# Patient Record
Sex: Female | Born: 1940 | Race: White | Hispanic: No | Marital: Married | Smoking: Never smoker
Health system: Southern US, Community
[De-identification: ages and names within clinical notes are randomized; demographics above are authoritative.]

## PROBLEM LIST (undated history)

## (undated) DIAGNOSIS — I10 Essential (primary) hypertension: Secondary | ICD-10-CM

## (undated) DIAGNOSIS — E119 Type 2 diabetes mellitus without complications: Secondary | ICD-10-CM

## (undated) DIAGNOSIS — I4891 Unspecified atrial fibrillation: Secondary | ICD-10-CM

## (undated) HISTORY — PX: BACK SURGERY: SHX140

## (undated) HISTORY — PX: CHOLECYSTECTOMY: SHX55

## (undated) HISTORY — PX: ATRIAL FIBRILLATION ABLATION: EP1191

---

## 2016-12-12 ENCOUNTER — Emergency Department (HOSPITAL_COMMUNITY): Payer: Medicare Other

## 2016-12-12 ENCOUNTER — Encounter (HOSPITAL_COMMUNITY): Payer: Self-pay

## 2016-12-12 ENCOUNTER — Emergency Department (HOSPITAL_COMMUNITY)
Admission: EM | Admit: 2016-12-12 | Discharge: 2016-12-12 | Disposition: A | Discharge status: Home / Self Care | Payer: Medicare Other | Attending: Physician Assistant | Admitting: Physician Assistant

## 2016-12-12 DIAGNOSIS — Y9301 Activity, walking, marching and hiking: Secondary | ICD-10-CM | POA: Diagnosis not present

## 2016-12-12 DIAGNOSIS — Y929 Unspecified place or not applicable: Secondary | ICD-10-CM | POA: Insufficient documentation

## 2016-12-12 DIAGNOSIS — S99911A Unspecified injury of right ankle, initial encounter: Secondary | ICD-10-CM | POA: Diagnosis present

## 2016-12-12 DIAGNOSIS — Z7982 Long term (current) use of aspirin: Secondary | ICD-10-CM | POA: Insufficient documentation

## 2016-12-12 DIAGNOSIS — E119 Type 2 diabetes mellitus without complications: Secondary | ICD-10-CM | POA: Insufficient documentation

## 2016-12-12 DIAGNOSIS — S82891A Other fracture of right lower leg, initial encounter for closed fracture: Secondary | ICD-10-CM | POA: Insufficient documentation

## 2016-12-12 DIAGNOSIS — Z7984 Long term (current) use of oral hypoglycemic drugs: Secondary | ICD-10-CM | POA: Insufficient documentation

## 2016-12-12 DIAGNOSIS — W108XXA Fall (on) (from) other stairs and steps, initial encounter: Secondary | ICD-10-CM | POA: Diagnosis not present

## 2016-12-12 DIAGNOSIS — I10 Essential (primary) hypertension: Secondary | ICD-10-CM | POA: Diagnosis not present

## 2016-12-12 DIAGNOSIS — Y999 Unspecified external cause status: Secondary | ICD-10-CM | POA: Insufficient documentation

## 2016-12-12 HISTORY — DX: Unspecified atrial fibrillation: I48.91

## 2016-12-12 HISTORY — DX: Essential (primary) hypertension: I10

## 2016-12-12 HISTORY — DX: Type 2 diabetes mellitus without complications: E11.9

## 2016-12-12 MED ORDER — OXYCODONE-ACETAMINOPHEN 5-325 MG PO TABS: 1.0000 | ORAL_TABLET | ORAL | 0 refills | Status: AC | PRN

## 2016-12-12 MED ORDER — ONDANSETRON 8 MG PO TBDP
8.0000 mg | ORAL_TABLET | Freq: Once | ORAL | Status: AC
  Administered 2016-12-12: 8 mg via ORAL
  Filled 2016-12-12: qty 1

## 2016-12-12 MED ORDER — FENTANYL CITRATE (PF) 100 MCG/2ML IJ SOLN
100.0000 ug | Freq: Once | INTRAMUSCULAR | Status: AC
  Administered 2016-12-12: 100 ug via NASAL
  Filled 2016-12-12: qty 2

## 2016-12-12 MED ORDER — DOCUSATE SODIUM 100 MG PO CAPS: 100.0000 mg | ORAL_CAPSULE | Freq: Two times a day (BID) | ORAL | 0 refills | Status: AC

## 2016-12-12 MED ORDER — ONDANSETRON HCL 4 MG PO TABS: 4.0000 mg | ORAL_TABLET | Freq: Three times a day (TID) | ORAL | 0 refills | Status: AC | PRN

## 2016-12-12 NOTE — ED Provider Notes (Signed)
[facility]-EMERGENCY DEPT Provider Note   CSN: [id number] Arrival date & time: 12/12/16  1433     History   Chief Complaint Chief Complaint  Patient presents with  . Fall  . Ankle Pain    RIGHT    HPI Ruth Cochran is a 76 y.o. female who is here visiting a friend from her home state of [place]. Patient was walking down the steps and turned to say something, stumbled, got her right ankle caught in between 2 steps as she fell down. She is immediate severe pain in the ankle with inability to bear weight. She denies any previous injuries to this area. 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 She denies hitting her head or losing consciousness and has no other complaints at this time.  HPI  Past Medical History:  Diagnosis Date  . Atrial fibrillation (HCC)   . Diabetes mellitus without complication (HCC)   . Hypertension     There are no active problems to display for this patient.   Past Surgical History:  Procedure Laterality Date  . ATRIAL FIBRILLATION ABLATION    . BACK SURGERY    . CHOLECYSTECTOMY      OB History    No data available       Home Medications    Prior to Admission medications   Medication Sig Start Date End Date Taking? Authorizing Provider  acetaminophen-codeine (TYLENOL #3) 300-30 MG tablet Take 1 tablet by mouth every 4 (four) hours as needed for moderate pain.   Yes Historical Provider, MD  ALPRAZolam Prudy Feeler) 0.5 MG tablet Take 0.5 mg by mouth at bedtime as needed for sleep.    Yes Historical Provider, MD  aspirin EC 81 MG tablet Take 81 mg by mouth daily.   Yes Historical Provider, MD  cholecalciferol (VITAMIN D) 1000 units tablet Take 2,000 Units by mouth daily.   Yes Historical Provider, MD  Cyanocobalamin (VITAMIN B-12) 5000 MCG SUBL Place 5,000 mcg under the tongue daily.   Yes Historical Provider, MD  diphenhydrAMINE (BENADRYL) 50 MG capsule Take 50 mg by mouth every 6 (six) hours as needed for allergies.   Yes  Historical Provider, MD  enalapril (VASOTEC) 20 MG tablet Take 20 mg by mouth daily.   Yes Historical Provider, MD  fluticasone (FLONASE) 50 MCG/ACT nasal spray Place 2 sprays into both nostrils daily as needed for rhinitis.   Yes Historical Provider, MD  fluticasone furoate-vilanterol (BREO ELLIPTA) 100-25 MCG/INH AEPB Inhale 1 puff into the lungs daily.   Yes Historical Provider, MD  gemfibrozil (LOPID) 600 MG tablet Take 600 mg by mouth 2 (two) times daily before a meal.   Yes Historical Provider, MD  glipiZIDE (GLUCOTROL) 10 MG tablet Take 10-20 mg by mouth 2 (two) times daily. Pt takes two tablets in the morning and one at night.   Yes Historical Provider, MD  levalbuterol [facility] HFA) 45 MCG/ACT inhaler Inhale 2 puffs into the lungs every 6 (six) hours as needed for wheezing or shortness of breath.   Yes Historical Provider, MD  Multiple Vitamins-Minerals (PRESERVISION AREDS 2+MULTI VIT) CAPS Take 1 capsule by mouth 2 (two) times daily.   Yes Historical Provider, MD  omeprazole (PRILOSEC) 20 MG capsule Take 20 mg by mouth at bedtime.   Yes Historical Provider, MD  pravastatin (PRAVACHOL) 80 MG tablet Take 80 mg by mouth at bedtime.   Yes Historical Provider, MD  pregabalin (LYRICA) 300 MG capsule Take 300 mg by mouth 2 (two) times  daily.   Yes Historical Provider, MD  docusate sodium (COLACE) 100 MG capsule Take 1 capsule (100 mg total) by mouth 2 (two) times daily. 12/12/16   Arthor Captain, PA-C  ondansetron (ZOFRAN) 4 MG tablet Take 1 tablet (4 mg total) by mouth every 8 (eight) hours as needed for nausea or vomiting. 12/12/16   Arthor Captain, PA-C  oxyCODONE-acetaminophen (PERCOCET) 5-325 MG tablet Take 1-2 tablets by mouth every 4 (four) hours as needed. 12/12/16   Arthor Captain, PA-C    Family History History reviewed. No pertinent family history.  Social History Social History  Substance Use Topics  . Smoking status: Never Smoker  . Smokeless tobacco: Never Used  . Alcohol use No      Allergies   Morphine and related and Niacin and related   Review of Systems Review of Systems  Ten systems reviewed and are negative for acute change, except as noted in the HPI.   Physical Exam Updated Vital Signs BP 129/86 (BP Location: Left Arm)   Pulse 92   Temp 98.2 F (36.8 C) (Oral)   Resp 15   Ht 5\' 3"  (1.6 m)   Wt 87.1 kg   SpO2 98%   BMI 34.01 kg/m   Physical Exam  Constitutional: She is oriented to person, place, and time. She appears well-developed and well-nourished. No distress.  HENT:  Head: Normocephalic and atraumatic.  Eyes: Conjunctivae and EOM are normal. Pupils are equal, round, and reactive to light. No scleral icterus.  Neck: Normal range of motion.  Cardiovascular: Normal rate, regular rhythm and normal heart sounds.  Exam reveals no gallop and no friction rub.   No murmur heard. Pulmonary/Chest: Effort normal and breath sounds normal. No respiratory distress.  Abdominal: Soft. Bowel sounds are normal. She exhibits no distension and no mass. There is no tenderness. There is no guarding.  Musculoskeletal:  R ankle examined in splint. Normal DP/PT pulse with <2 sec cap refill.  Neurological: She is alert and oriented to person, place, and time.  Skin: Skin is warm and dry. She is not diaphoretic.  Nursing note and vitals reviewed.    ED Treatments / Results  Labs (all labs ordered are listed, but only abnormal results are displayed) Labs Reviewed - No data to display  EKG  EKG Interpretation None       Radiology Dg Ankle Complete Right  Result Date: 12/12/2016 CLINICAL DATA:  Postreduction of right ankle. EXAM: RIGHT ANKLE - COMPLETE 3+ VIEW COMPARISON:  Right ankle radiographs of the same day. FINDINGS: There is partial reduction of bimalleolar fractures in the right ankle. Widening of the talotibial joint persists. There is slight anterior subluxation of the talus. Fiberglass cast is in place. IMPRESSION: Partial reduction of  trimalleolar right ankle fracture as described. Electronically Signed   By: Marin Roberts M.D.   On: 12/12/2016 17:40   Dg Ankle Complete Right  Result Date: 12/12/2016 CLINICAL DATA:  Right ankle pain and swelling EXAM: RIGHT ANKLE - COMPLETE 3+ VIEW COMPARISON:  None. FINDINGS: There is an oblique fracture of the distal right fibula with mild lateral displacement and posterior angulation. There is also a fracture of the medial malleolus that is predominantly transverse with minimal displacement. There is marked circumferential soft tissue swelling. IMPRESSION: Minimally displaced oblique fracture of the distal right fibula. Nondisplaced transverse fracture of the right medial malleolus. Electronically Signed   By: Deatra Robinson M.D.   On: 12/12/2016 15:22   Dg Foot Complete Right  Result  Date: 12/12/2016 CLINICAL DATA:  Right foot and ankle pain EXAM: RIGHT FOOT COMPLETE - 3+ VIEW COMPARISON:  None. FINDINGS: There is no evidence of fracture or dislocation. There is no evidence of arthropathy or other focal bone abnormality. Soft tissues are unremarkable. Fractures of the ankle are better characterized on the concomitant ankle radiograph. IMPRESSION: No acute fracture or dislocation of the right foot. Electronically Signed   By: Deatra RobinsonKevin  Herman M.D.   On: 12/12/2016 15:23    Procedures Procedures (including critical care time)  Medications Ordered in ED Medications  ondansetron (ZOFRAN-ODT) disintegrating tablet 8 mg (8 mg Oral Given 12/12/16 1645)  fentaNYL (SUBLIMAZE) injection 100 mcg (100 mcg Nasal Given 12/12/16 1645)     Initial Impression / Assessment and Plan / ED Course  I have reviewed the triage vital signs and the nursing notes.  Pertinent labs & imaging results that were available during my care of the patient were reviewed by me and considered in my medical decision making (see chart for details).     Patient with a timeout fracture. Her ankle was placed in splint and  reduced. Patient will be discharged with nonweightbearing. She was given a wheelchair along with crutches. Patient was in [place]. Will follow up with an orthopedist there. I did discuss the case with Dr. Ave Filterhandler. Patient appears safe for discharge at this time. Images, case and management. Discussed with Dr. Jones BroomJustin Chandler.  Final Clinical Impressions(s) / ED Diagnoses   Final diagnoses:  Closed fracture of right ankle, initial encounter    New Prescriptions Discharge Medication List as of 12/12/2016  6:26 PM    START taking these medications   Details  docusate sodium (COLACE) 100 MG capsule Take 1 capsule (100 mg total) by mouth 2 (two) times daily., Starting Tue 12/12/2016, Print    ondansetron (ZOFRAN) 4 MG tablet Take 1 tablet (4 mg total) by mouth every 8 (eight) hours as needed for nausea or vomiting., Starting Tue 12/12/2016, Print    oxyCODONE-acetaminophen (PERCOCET) 5-325 MG tablet Take 1-2 tablets by mouth every 4 (four) hours as needed., Starting Tue 12/12/2016, Print         Arthor CaptainAbigail Machael Raine, PA-C 12/13/16 2318    Courteney Lyn Mackuen, MD 12/13/16 2320

## 2016-12-12 NOTE — ED Triage Notes (Signed)
PT RECEIVED VIA EMS C/O RIGHT ANKLE PAIN AND SWELLING DUE TO MISSING 2 STEPS ON A STAIRWELL. PER EMS, PT SCRAPED HER RIGHT ELBOW, DENIES HEAD INJURY OR LOC. TEMPORARY SPLINT APPLIED BY EMS.

## 2016-12-12 NOTE — Discharge Instructions (Signed)
Follow these instructions at home:  Review correct crutch use with your health care provider and use your crutches as directed. Safe use of crutches is extremely important. Misuse of crutches can cause you to fall or cause injury to nerves in your hands or armpits.  Do not put weight or pressure on the injured ankle until directed by your health care provider.  To lessen the swelling, keep the injured leg elevated while sitting or lying down.  Apply ice to the injured area: ? Put ice in a plastic bag. ? Place a towel between your cast and the bag. ? Leave the ice on for 20 minutes, 2-3 times a day.  If you have a plaster or fiberglass cast: ? Do not try to scratch the skin under the cast with any objects. This can increase your risk of skin infection. ? Check the skin around the cast every day. You may put lotion on any red or sore areas. ? Keep your cast dry and clean.  If you have a plaster splint: ? Wear the splint as directed. ? You may loosen the elastic around the splint if your toes become numb, tingle, or turn cold or blue.  Do not put pressure on any part of your cast or splint; it may break. Rest your cast only on a pillow the first 24 hours until it is fully hardened.  Your cast or splint can be protected during bathing with a plastic bag sealed to your skin with medical tape. Do not lower the cast or splint into water.  Take medicines as directed by your health care provider. Only take over-the-counter or prescription medicines for pain, discomfort, or fever as directed by your health care provider.  Do not drive a vehicle until your health care provider specifically tells you it is safe to do so.  If your health care provider has given you a follow-up appointment, it is very important to keep that appointment. Not keeping the appointment could result in a chronic or permanent injury, pain, and disability. If you have any problem keeping the appointment, call the facility  for assistance. Contact a health care provider if: You develop increased swelling or discomfort. Get help right away if:  Your cast gets damaged or breaks.  You have continued severe pain.  You develop new pain or swelling after the cast was put on.  Your skin or toenails below the injury turn blue or gray.  Your skin or toenails below the injury feel cold, numb, or have loss of sensitivity to touch.  There is a bad smell or pus draining from under the cast.

## 2016-12-12 NOTE — ED Notes (Signed)
PT DISCHARGED. INSTRUCTIONS AND PRESCRIPTIONS GIVEN. AAOX4. PT IN NO APPARENT DISTRESS. THE OPPORTUNITY TO ASK QUESTIONS WAS PROVIDED. 

## 2016-12-12 NOTE — ED Notes (Signed)
Bed: WA07 Expected date:  Expected time:  Means of arrival:  Comments: 76 yo ankle pain

## 2018-09-20 IMAGING — DX DG FOOT COMPLETE 3+V*R*
3 series · 3 of 3 positions shown · non-contrast
Comparison: None.

CLINICAL DATA: Right foot and ankle pain

EXAM:
RIGHT FOOT COMPLETE - 3+ VIEW

[foot ap]
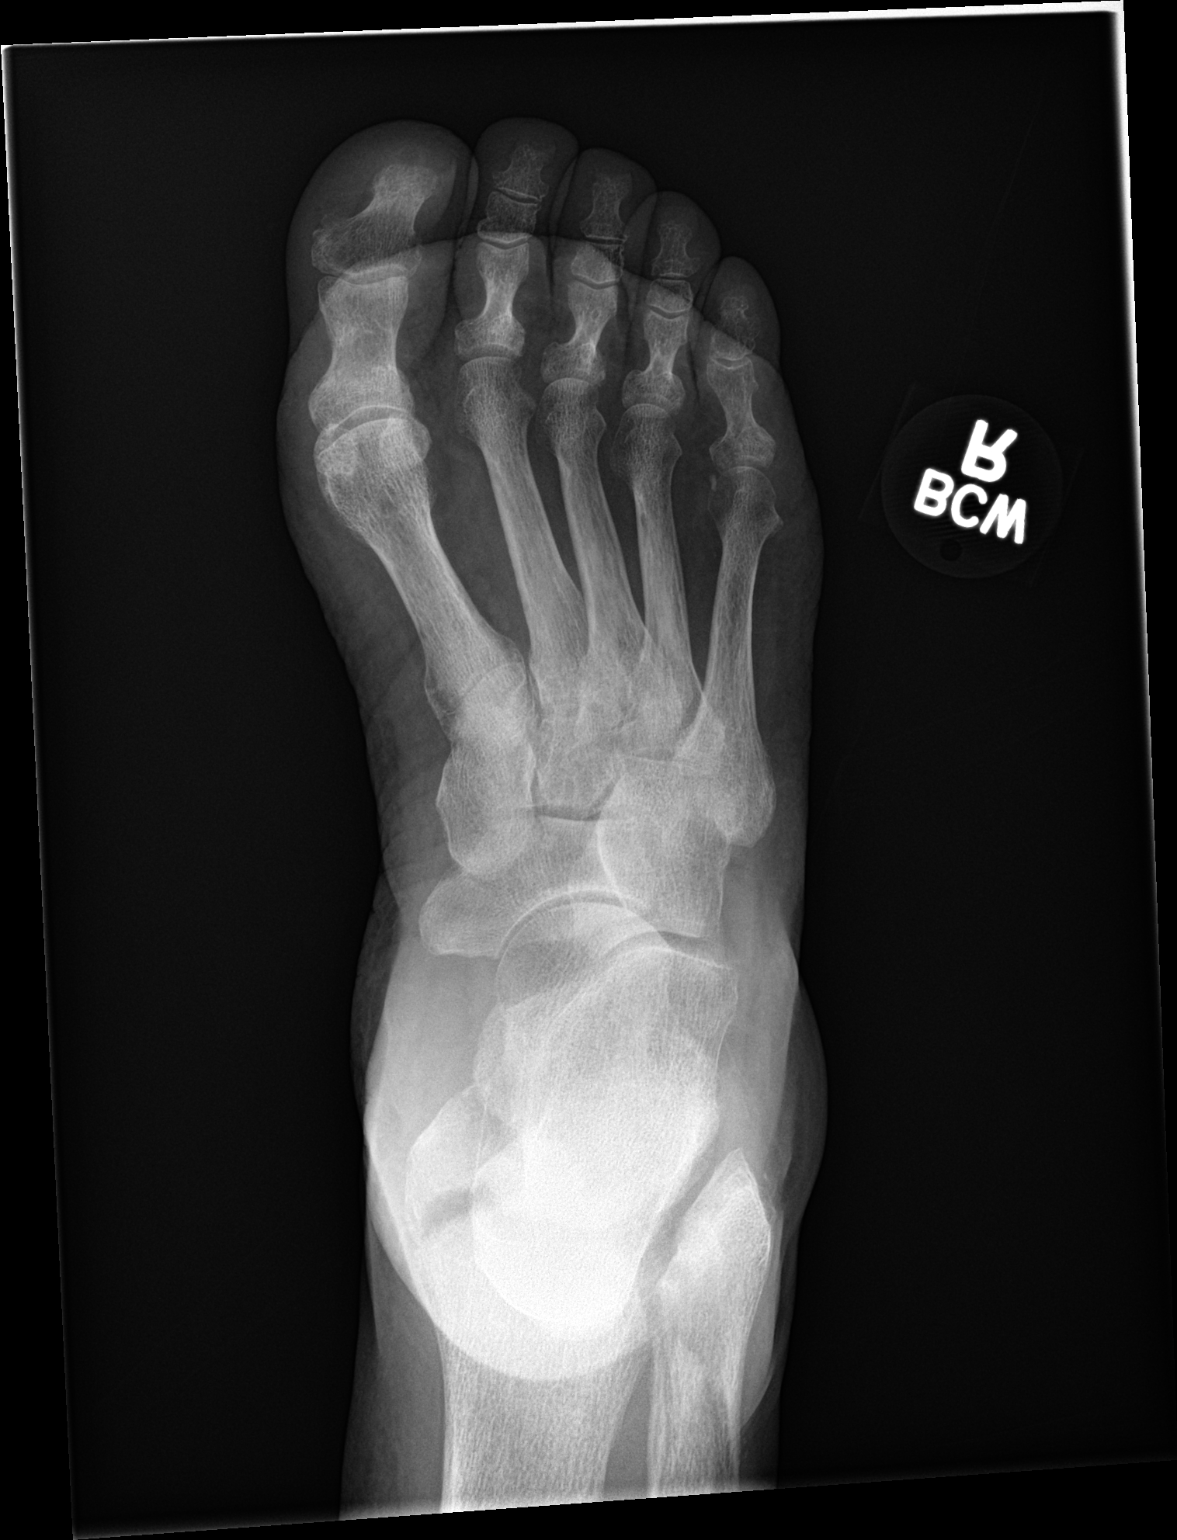

[foot obl]
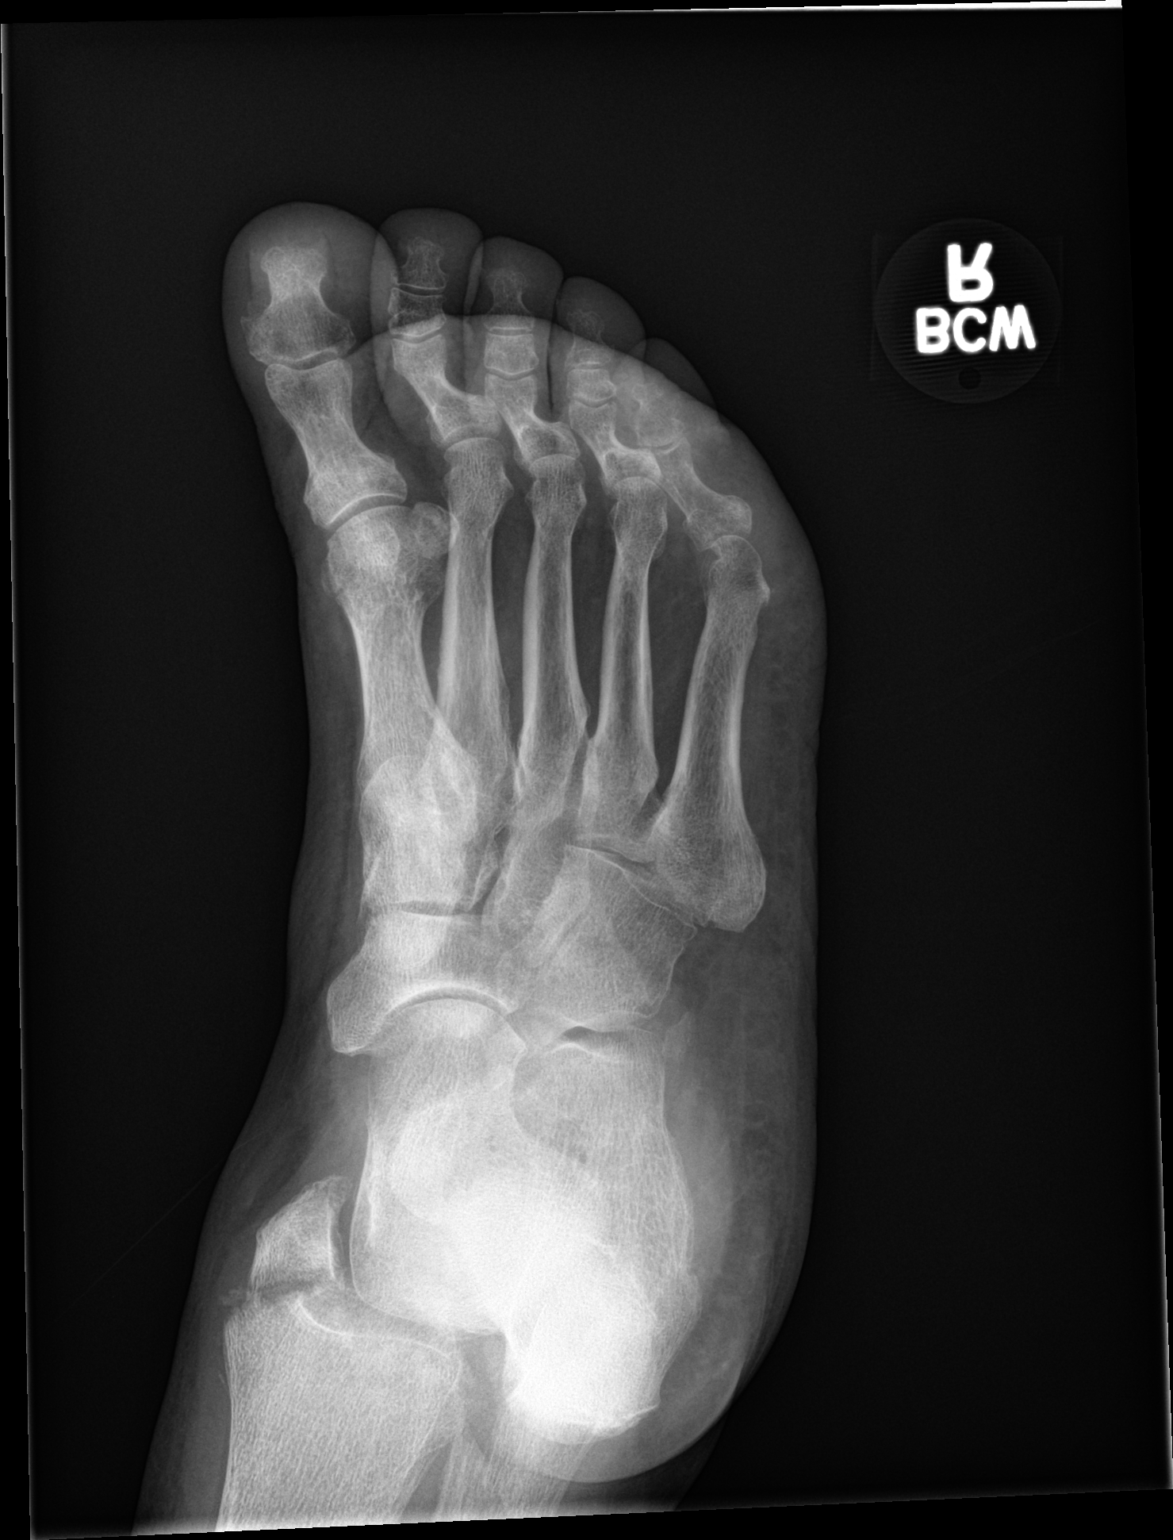

[foot lat]
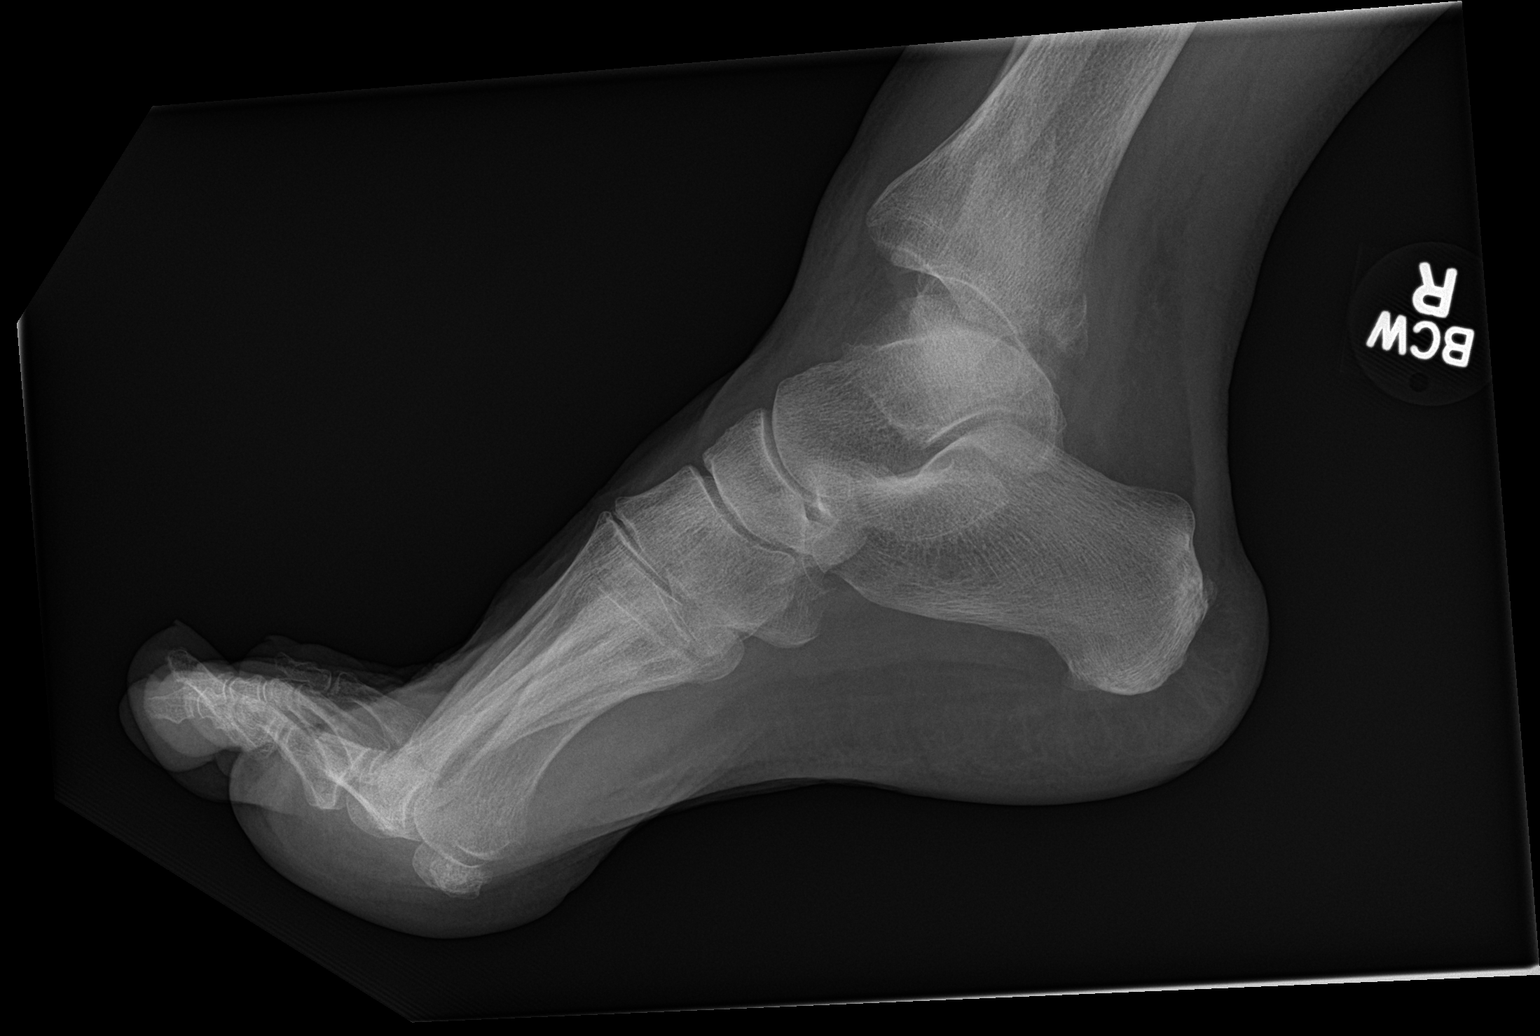

[3 of 3 positions shown; findings below may reference images not displayed]

FINDINGS: There is no evidence of fracture or dislocation. There is no
evidence of arthropathy or other focal bone abnormality. Soft
tissues are unremarkable. Fractures of the ankle are better
characterized on the concomitant ankle radiograph.
IMPRESSION: No acute fracture or dislocation of the right foot.
